# Patient Record
Sex: Male | Born: 1985 | Hispanic: Yes | Marital: Single | Smoking: Current every day smoker
Health system: Southern US, Community
[De-identification: ages and names within clinical notes are randomized; demographics above are authoritative.]

---

## 2013-10-30 ENCOUNTER — Emergency Department (HOSPITAL_COMMUNITY)
Admission: EM | Admit: 2013-10-30 | Discharge: 2013-10-30 | Disposition: A | Discharge status: Home / Self Care | Payer: Self-pay | Attending: Emergency Medicine | Admitting: Emergency Medicine

## 2013-10-30 ENCOUNTER — Emergency Department (HOSPITAL_COMMUNITY): Payer: Self-pay

## 2013-10-30 ENCOUNTER — Encounter (HOSPITAL_COMMUNITY): Payer: Self-pay | Admitting: Emergency Medicine

## 2013-10-30 DIAGNOSIS — R1032 Left lower quadrant pain: Secondary | ICD-10-CM | POA: Insufficient documentation

## 2013-10-30 DIAGNOSIS — R1012 Left upper quadrant pain: Secondary | ICD-10-CM | POA: Insufficient documentation

## 2013-10-30 DIAGNOSIS — F17200 Nicotine dependence, unspecified, uncomplicated: Secondary | ICD-10-CM | POA: Insufficient documentation

## 2013-10-30 DIAGNOSIS — R1110 Vomiting, unspecified: Secondary | ICD-10-CM | POA: Insufficient documentation

## 2013-10-30 LAB — COMPREHENSIVE METABOLIC PANEL
ALK PHOS: 88 U/L (ref 39–117)
ALT: 29 U/L (ref 0–53)
AST: 23 U/L (ref 0–37)
Albumin: 4.4 g/dL (ref 3.5–5.2)
Anion gap: 14 (ref 5–15)
BILIRUBIN TOTAL: 0.4 mg/dL (ref 0.3–1.2)
BUN: 17 mg/dL (ref 6–23)
CHLORIDE: 101 meq/L (ref 96–112)
CO2: 26 mEq/L (ref 19–32)
Calcium: 9.1 mg/dL (ref 8.4–10.5)
Creatinine, Ser: 0.74 mg/dL (ref 0.50–1.35)
GFR calc Af Amer: >90 mL/min (ref >90)
GFR calc non Af Amer: >90 mL/min (ref >90)
Glucose, Bld: 136 mg/dL — ABNORMAL HIGH (ref 70–99)
POTASSIUM: 4.2 meq/L (ref 3.7–5.3)
Sodium: 141 mEq/L (ref 137–147)
Total Protein: 7.8 g/dL (ref 6.0–8.3)

## 2013-10-30 LAB — URINALYSIS, ROUTINE W REFLEX MICROSCOPIC
Bilirubin Urine: NEGATIVE
GLUCOSE, UA: NEGATIVE mg/dL
Hgb urine dipstick: NEGATIVE
Ketones, ur: NEGATIVE mg/dL
LEUKOCYTES UA: NEGATIVE
Nitrite: NEGATIVE
PH: 8 (ref 5.0–8.0)
Protein, ur: 30 mg/dL — AB
SPECIFIC GRAVITY, URINE: 1.026 (ref 1.005–1.030)
Urobilinogen, UA: 1 mg/dL (ref 0.0–1.0)

## 2013-10-30 LAB — CBC WITH DIFFERENTIAL/PLATELET
BASOS ABS: 0 10*3/uL (ref 0.0–0.1)
BASOS PCT: 0 % (ref 0–1)
Eosinophils Absolute: 0.1 10*3/uL (ref 0.0–0.7)
Eosinophils Relative: 1 % (ref 0–5)
HCT: 42.4 % (ref 39.0–52.0)
Hemoglobin: 15 g/dL (ref 13.0–17.0)
LYMPHS PCT: 24 % (ref 12–46)
Lymphs Abs: 2.7 10*3/uL (ref 0.7–4.0)
MCH: 31.6 pg (ref 26.0–34.0)
MCHC: 35.4 g/dL (ref 30.0–36.0)
MCV: 89.5 fL (ref 78.0–100.0)
MONOS PCT: 6 % (ref 3–12)
Monocytes Absolute: 0.7 10*3/uL (ref 0.1–1.0)
NEUTROS ABS: 7.6 10*3/uL (ref 1.7–7.7)
NEUTROS PCT: 69 % (ref 43–77)
Platelets: 270 10*3/uL (ref 150–400)
RBC: 4.74 MIL/uL (ref 4.22–5.81)
RDW: 12.5 % (ref 11.5–15.5)
WBC: 11.1 10*3/uL — ABNORMAL HIGH (ref 4.0–10.5)

## 2013-10-30 LAB — URINE MICROSCOPIC-ADD ON

## 2013-10-30 LAB — I-STAT TROPONIN, ED: Troponin i, poc: 0 ng/mL (ref 0.00–0.08)

## 2013-10-30 LAB — LIPASE, BLOOD: Lipase: 18 U/L (ref 11–59)

## 2013-10-30 MED ORDER — SODIUM CHLORIDE 0.9 % IV BOLUS (SEPSIS)
1000.0000 mL | Freq: Once | INTRAVENOUS | Status: AC
  Administered 2013-10-30: 1000 mL via INTRAVENOUS

## 2013-10-30 MED ORDER — IOHEXOL 300 MG/ML  SOLN
25.0000 mL | Freq: Once | INTRAMUSCULAR | Status: AC | PRN
  Administered 2013-10-30: 25 mL via ORAL

## 2013-10-30 MED ORDER — HYDROCODONE-ACETAMINOPHEN 5-325 MG PO TABS: 1.0000 | ORAL_TABLET | Freq: Four times a day (QID) | ORAL | Status: AC | PRN

## 2013-10-30 MED ORDER — MORPHINE SULFATE 4 MG/ML IJ SOLN
6.0000 mg | Freq: Once | INTRAMUSCULAR | Status: AC
  Administered 2013-10-30: 6 mg via INTRAVENOUS
  Filled 2013-10-30: qty 2

## 2013-10-30 MED ORDER — ONDANSETRON HCL 4 MG/2ML IJ SOLN
4.0000 mg | Freq: Once | INTRAMUSCULAR | Status: AC
  Administered 2013-10-30: 4 mg via INTRAVENOUS
  Filled 2013-10-30: qty 2

## 2013-10-30 MED ORDER — IOHEXOL 300 MG/ML  SOLN
80.0000 mL | Freq: Once | INTRAMUSCULAR | Status: AC | PRN
  Administered 2013-10-30: 80 mL via INTRAVENOUS

## 2013-10-30 NOTE — ED Notes (Signed)
Patient transported to CT 

## 2013-10-30 NOTE — ED Notes (Signed)
MD at bedside. Dr. Walden. 

## 2013-10-30 NOTE — ED Notes (Signed)
IV removed from rt. AC. 2X2  Applied with tape.

## 2013-10-30 NOTE — ED Provider Notes (Signed)
CSN: [id number]     Arrival date & time 10/30/13  [id number] History   First MD Initiated Contact with Patient 10/30/13 [id number]     Chief Complaint  Patient presents with  . Abdominal Pain     (Consider location/radiation/quality/duration/timing/severity/associated sxs/prior Treatment) Patient is a 28 y.o. male presenting with abdominal pain. The history is provided by the patient.  Abdominal Pain Pain location:  LUQ and LLQ Pain quality: aching and burning   Pain radiates to:  Does not radiate Pain severity:  Mild Onset quality:  Sudden Timing:  Constant Progression:  Unchanged Chronicity:  New Context comment:  Was asleep Relieved by:  Nothing Worsened by:  Nothing tried Associated symptoms: vomiting   Associated symptoms: no chills, no cough, no diarrhea, no dysuria, no fever, no nausea and no shortness of breath     History reviewed. No pertinent past medical history. History reviewed. No pertinent past surgical history. No family history on file. History  Substance Use Topics  . Smoking status: Current Every Day Smoker    Types: Cigarettes  . Smokeless tobacco: Not on file  . Alcohol Use: Yes    Review of Systems  Constitutional: Negative for fever and chills.  Respiratory: Negative for cough and shortness of breath.   Gastrointestinal: Positive for vomiting and abdominal pain. Negative for nausea and diarrhea.  Genitourinary: Negative for dysuria, penile pain and testicular pain.  All other systems reviewed and are negative.     Allergies  Review of patient's allergies indicates no known allergies.  Home Medications   Prior to Admission medications   Medication Sig Start Date End Date Taking? Authorizing Provider  OVER THE COUNTER MEDICATION Take 1 tablet by mouth daily. Pain medication   Yes Historical Provider, MD   BP 130/76  Pulse 55  Temp(Src) 98 F (36.7 C) (Oral)  Resp 16  Ht 5\' 4"  (1.626 m)  Wt 138 lb (62.596 kg)  BMI 23.68 kg/m2  SpO2  99% Physical Exam  Nursing note and vitals reviewed. Constitutional: He is oriented to person, place, and time. He appears well-developed and well-nourished. No distress.  HENT:  Head: Normocephalic and atraumatic.  Mouth/Throat: Oropharynx is clear and moist. No oropharyngeal exudate.  Eyes: EOM are normal. Pupils are equal, round, and reactive to light.  Neck: Normal range of motion. Neck supple.  Cardiovascular: Normal rate and regular rhythm.  Exam reveals no friction rub.   No murmur heard. Pulmonary/Chest: Effort normal and breath sounds normal. No respiratory distress. He has no wheezes. He has no rales.  Abdominal: Soft. He exhibits no distension. There is tenderness (mild LUQ, LLQ). There is no rebound.  Genitourinary: Testes normal and penis normal.  Musculoskeletal: Normal range of motion. He exhibits no edema.  Neurological: He is alert and oriented to person, place, and time.  Skin: He is not diaphoretic.    ED Course  Procedures (including critical care time) Labs Review Labs Reviewed  CBC WITH DIFFERENTIAL - Abnormal; Notable for the following:    WBC 11.1 (*)    All other components within normal limits  COMPREHENSIVE METABOLIC PANEL - Abnormal; Notable for the following:    Glucose, Bld 136 (*)    All other components within normal limits  URINALYSIS, ROUTINE W REFLEX MICROSCOPIC - Abnormal; Notable for the following:    APPearance CLOUDY (*)    Protein, ur 30 (*)    All other components within normal limits  URINE MICROSCOPIC-ADD ON - Abnormal; Notable for the following:  Bacteria, UA MANY (*)    All other components within normal limits  LIPASE, BLOOD  I-STAT TROPOININ, ED    Imaging Review Ct Abdomen Pelvis W Contrast  10/30/2013   CLINICAL DATA:  Left side abdominal pain.  EXAM: CT ABDOMEN AND PELVIS WITH CONTRAST  TECHNIQUE: Multidetector CT imaging of the abdomen and pelvis was performed using the standard protocol following bolus administration of  intravenous contrast.  CONTRAST:  80mL OMNIPAQUE IOHEXOL 300 MG/ML  SOLN  COMPARISON:  None.  FINDINGS: The liver, biliary tree, spleen, pancreas, adrenal glands, and kidneys are normal. The bowel is normal including the terminal ileum and appendix. Bladder and prostate gland are normal. No free air or free fluid. Muscle structures are normal.  The patient does have a multi-septated cystic lesion in the left ilium adjacent to the left sacroiliac joint measuring 6.3 x 3.9 x 2.2 cm. There is no cortical disruption or soft tissue component extrinsic to the bone. There is no bone expansion. There is a well-defined sclerotic margin. No adenopathy.  IMPRESSION: 1. Cystic lesion in the left ilium. I suspect this represents a simple bone cyst. MRI with and without contrast could further characterize the lesion. 2. Otherwise benign appearing abdomen and pelvis.   Electronically Signed   By: Geanie Cooley M.D.   On: 10/30/2013 09:17     EKG Interpretation None      MDM   Final diagnoses:  Left lower quadrant pain    30M with her L sided abdominal pain. Began while at rest. Lambert Mody, left sided. Associated vomiting. No fevers, diarrhea, testicle pain, dysuria. On exam, L sided abdominal pain, none on R side. Normal GU exam. Will scan. Pain meds and fluids given.  CT scan normal. Feeling better after meds. Labs ok. Patient given pain meds, stable for discharge.  Elwin Mocha, MD 10/30/13 [phone]

## 2013-10-30 NOTE — Discharge Instructions (Signed)
Dolor abdominal °(Abdominal Pain) °El dolor puede tener muchas causas. Normalmente la causa del dolor abdominal no es una enfermedad y mejorará sin tratamiento. Frecuentemente puede controlarse y tratarse en casa. Su médico le realizará un examen físico y posiblemente solicite análisis de sangre y radiografías para ayudar a determinar la gravedad de su dolor. Sin embargo, en muchos casos, debe transcurrir más tiempo antes de que se pueda encontrar una causa evidente del dolor. Antes de llegar a ese punto, es posible que su médico no sepa si necesita más pruebas o un tratamiento más profundo. °INSTRUCCIONES PARA EL CUIDADO EN EL HOGAR  °Esté atento al dolor para ver si hay cambios. Las siguientes indicaciones ayudarán a aliviar cualquier molestia que pueda sentir: °· Tome solo medicamentos de venta libre o recetados, según las indicaciones del médico. °· No tome laxantes a menos que se lo haya indicado su médico. °· Pruebe con una dieta líquida absoluta (caldo, té o agua) según se lo indique su médico. Introduzca gradualmente una dieta normal, según su tolerancia. °SOLICITE ATENCIÓN MÉDICA SI: °· Tiene dolor abdominal sin explicación. °· Tiene dolor abdominal relacionado con náuseas o diarrea. °· Tiene dolor cuando orina o defeca. °· Experimenta dolor abdominal que lo despierta de noche. °· Tiene dolor abdominal que empeora o mejora cuando come alimentos. °· Tiene dolor abdominal que empeora cuando come alimentos grasosos. °· Tiene fiebre. °SOLICITE ATENCIÓN MÉDICA DE INMEDIATO SI:  °· El dolor no desaparece en un plazo máximo de 2 horas. °· No deja de (vomitar). °· El dolor se siente solo en partes del abdomen, como el lado derecho o la parte inferior izquierda del abdomen. °· Evacúa materia fecal sanguinolenta o negra, de aspecto alquitranado. °ASEGÚRESE DE QUE: °· Comprende estas instrucciones. °· Controlará su afección. °· Recibirá ayuda de inmediato si no mejora o si empeora. °Document Released: 03/13/2005  Document Revised: 03/18/2013 °[employer]® Patient Information ©2015 [employer]. This information is not intended to replace advice given to you by your health care provider. Make sure you discuss any questions you have with your health care provider. ° °

## 2013-10-30 NOTE — ED Notes (Signed)
Pt arrives POV with c/o abd pain starting at 0200 or 0300 this morning. States its very painful, rates 8/10. No surgical hx. L sided pain, tenderness. Burning

## 2015-05-02 IMAGING — CT CT ABD-PELV W/ CM
2 of 4 series · 11 of 46 positions shown, 12 images · IV contrast (Iodine)
Comparison: None.

CLINICAL DATA: Left side abdominal pain.

EXAM:
CT ABDOMEN AND PELVIS WITH CONTRAST
TECHNIQUE: Multidetector CT imaging of the abdomen and pelvis was performed
using the standard protocol following bolus administration of
intravenous contrast.
CONTRAST:  80mL OMNIPAQUE IOHEXOL 300 MG/ML  SOLN

[Series 201: routine, idose (2) · axial · 0.78mm/px · z∈[-436,-56]mm · 8 of 90 slices shown, 9 images]
[im 7/90  soft-tissue]
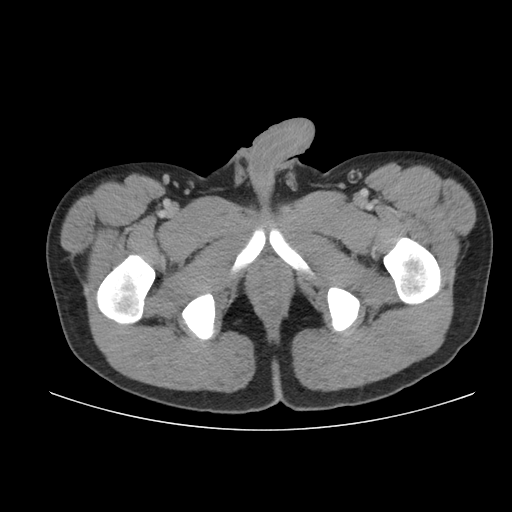
[im 7/90  bone]
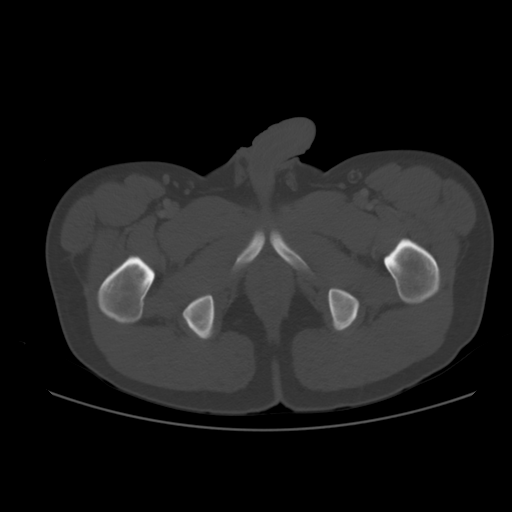
[im 18/90  soft-tissue]
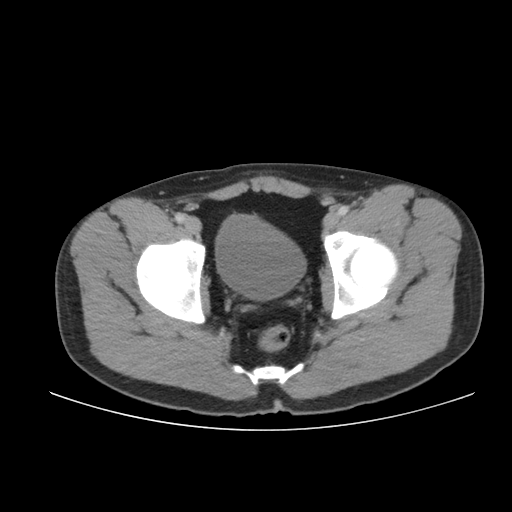
[im 28/90  soft-tissue]
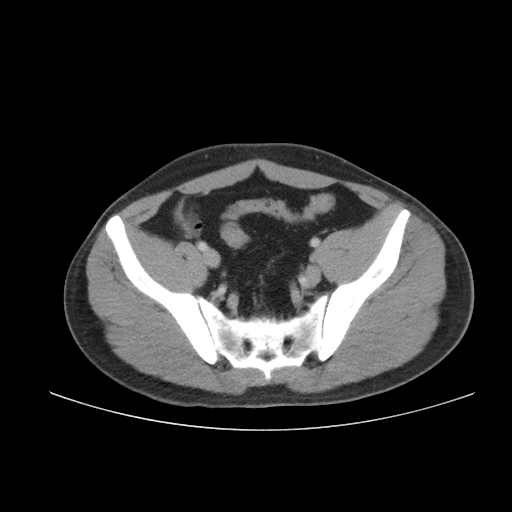
[im 38/90  soft-tissue]
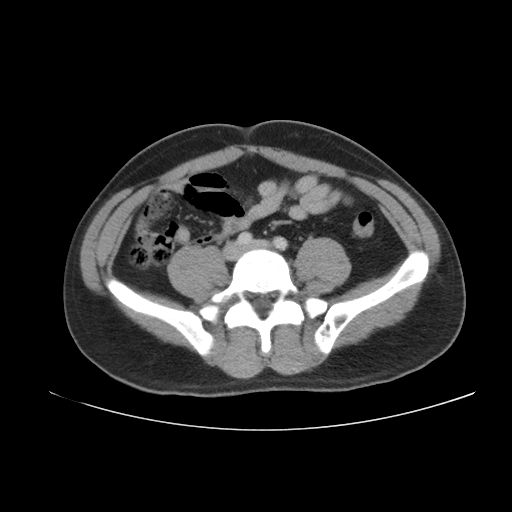
[im 52/90  soft-tissue]
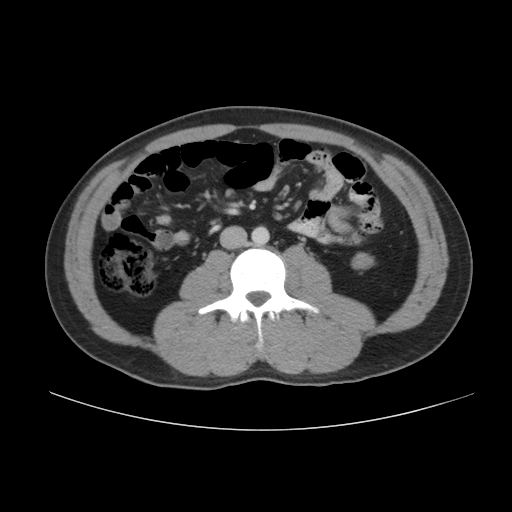
[im 62/90  soft-tissue]
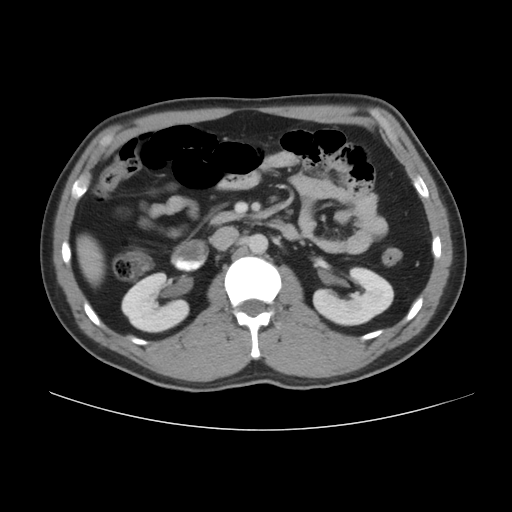
[im 72/90  soft-tissue]
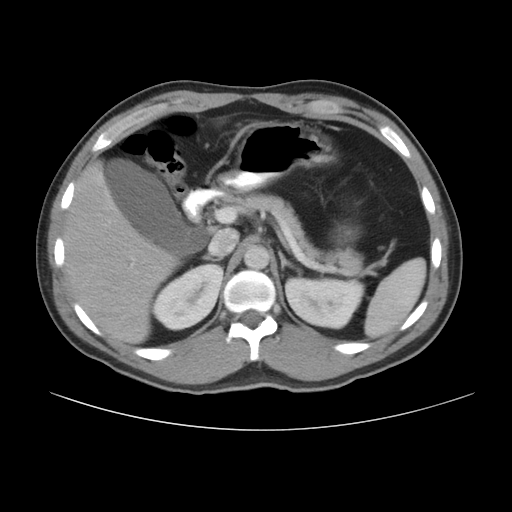
[im 83/90  soft-tissue]
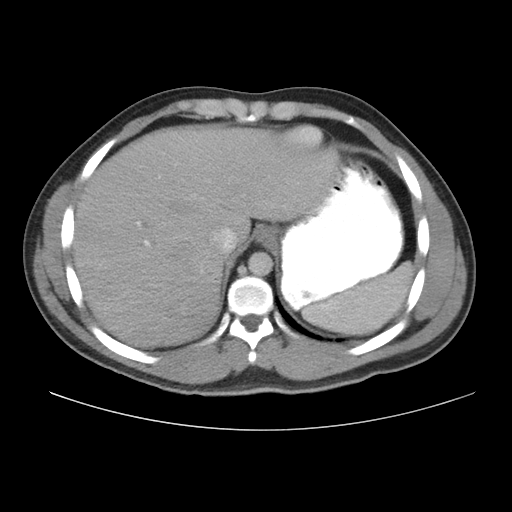

[Series 203: coronals, idose (2) · coronal · 0.45mm/px · 3 of 96 slices shown]
[im 32/96  soft-tissue]
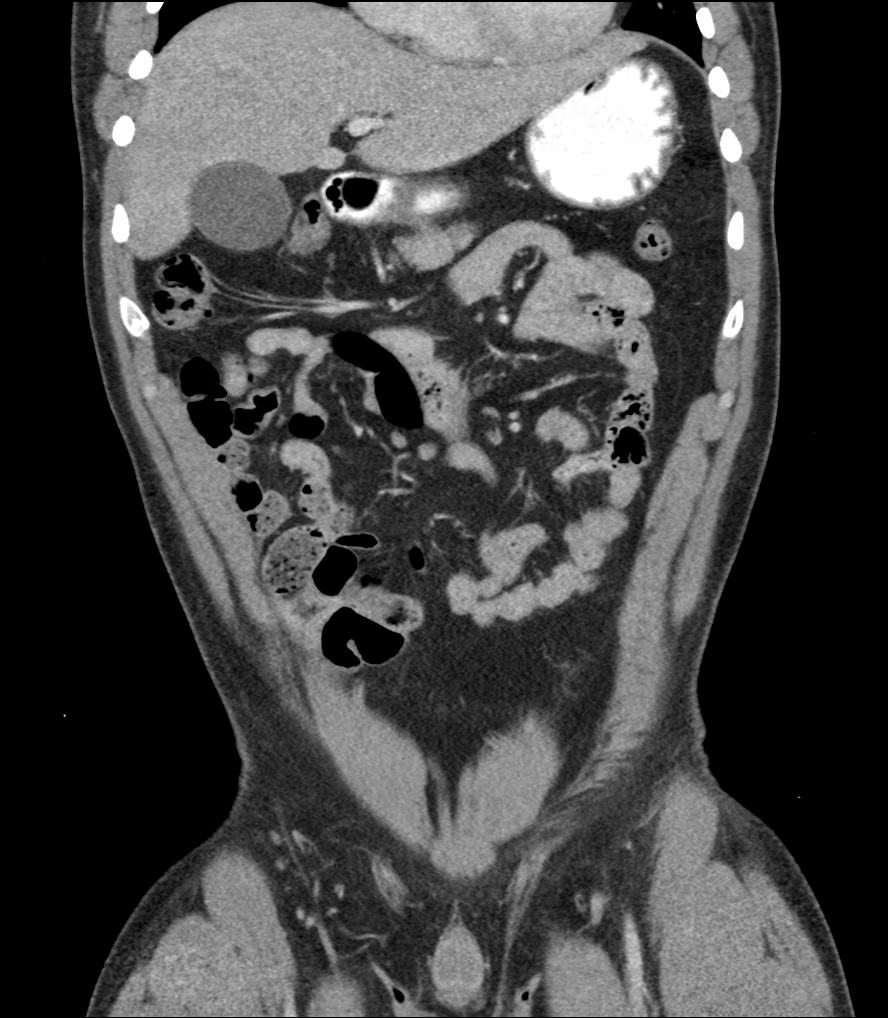
[im 43/96  soft-tissue]
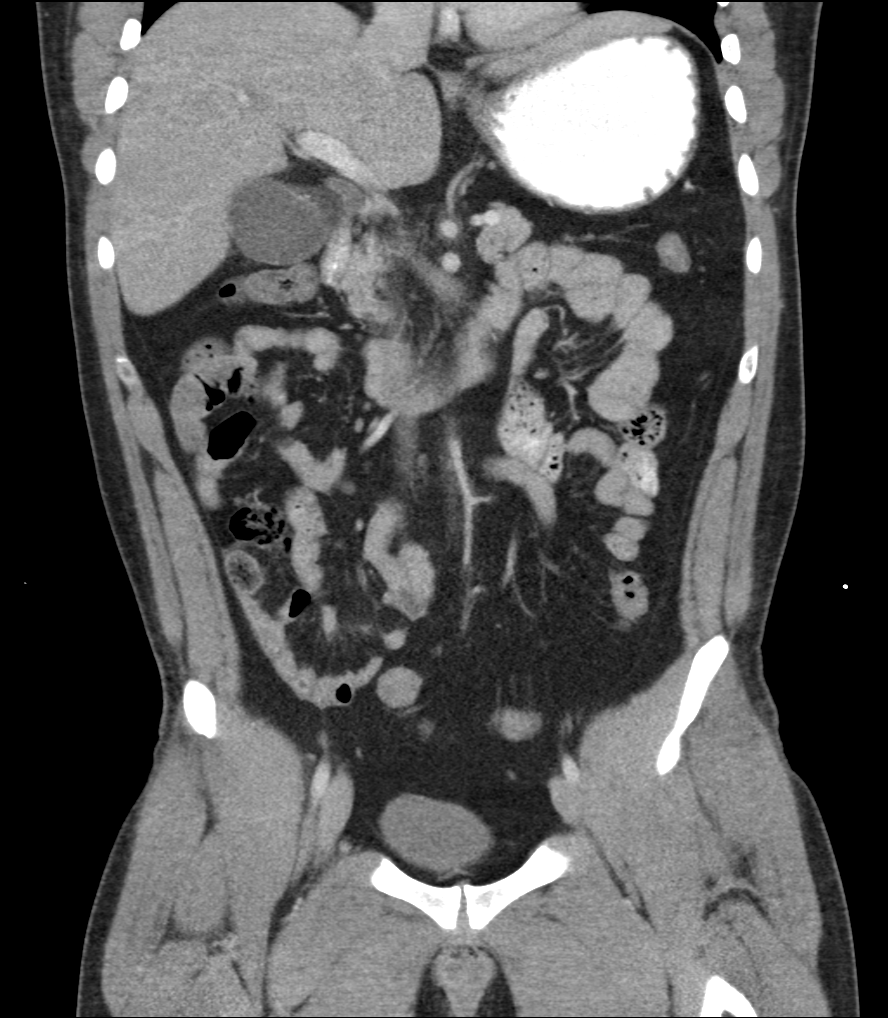
[im 53/96  soft-tissue]
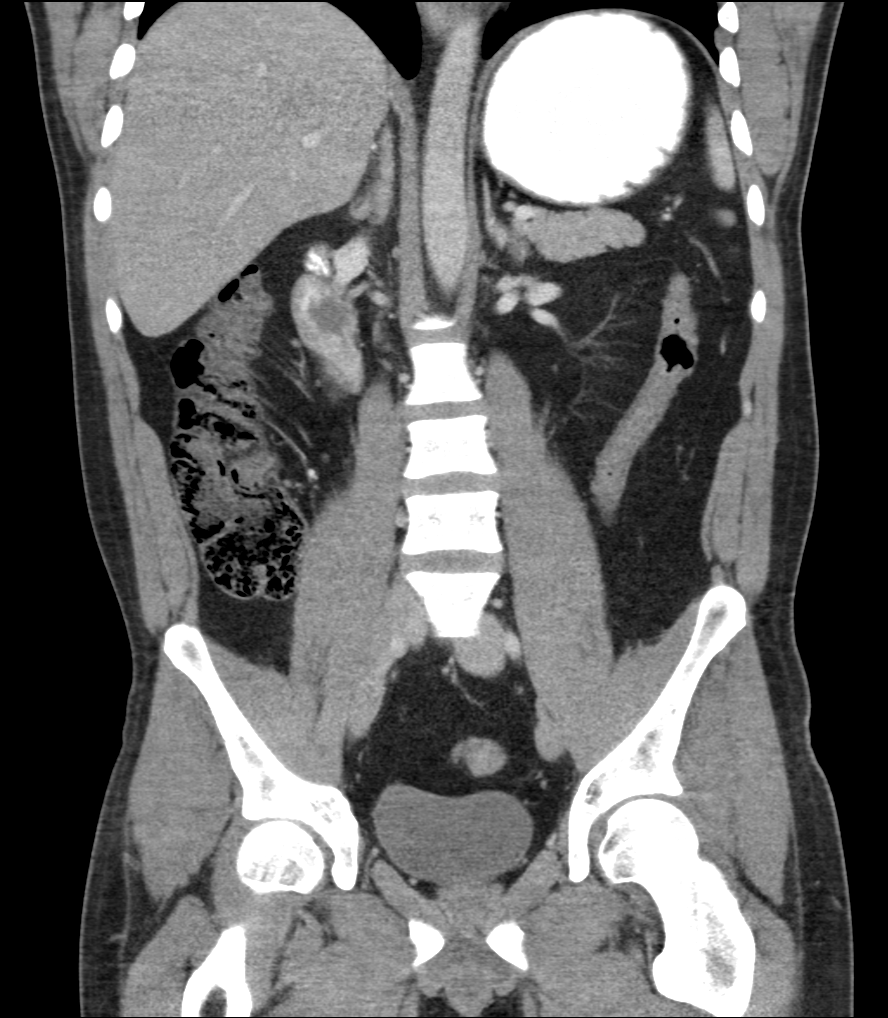

[11 of 46 positions shown; findings below may reference images not displayed]

FINDINGS: The liver, biliary tree, spleen, pancreas, adrenal glands, and
kidneys are normal. The bowel is normal including the terminal ileum
and appendix. Bladder and prostate gland are normal. No free air or
free fluid. Muscle structures are normal.

The patient does have a multi-septated cystic lesion in the left
ilium adjacent to the left sacroiliac joint measuring 6.3 x 3.9 x
2.2 cm. There is no cortical disruption or soft tissue component
extrinsic to the bone. There is no bone expansion. There is a
well-defined sclerotic margin. No adenopathy.
IMPRESSION: 1. Cystic lesion in the left ilium. I suspect this represents a
simple bone cyst. MRI with and without contrast could further
characterize the lesion.
2. Otherwise benign appearing abdomen and pelvis.
